# Patient Record
Sex: Female | Born: 1980
Health system: Southern US, Community
[De-identification: ages and names within clinical notes are randomized; demographics above are authoritative.]

## PROBLEM LIST (undated history)

## (undated) DIAGNOSIS — E669 Obesity, unspecified: Secondary | ICD-10-CM

## (undated) DIAGNOSIS — I890 Lymphedema, not elsewhere classified: Secondary | ICD-10-CM

---

## 2012-12-30 ENCOUNTER — Encounter (HOSPITAL_COMMUNITY): Payer: Self-pay | Admitting: Emergency Medicine

## 2012-12-30 ENCOUNTER — Emergency Department (HOSPITAL_COMMUNITY)
Admission: EM | Admit: 2012-12-30 | Discharge: 2012-12-30 | Disposition: A | Discharge status: Home / Self Care | Payer: 59 | Attending: Emergency Medicine | Admitting: Emergency Medicine

## 2012-12-30 DIAGNOSIS — M791 Myalgia, unspecified site: Secondary | ICD-10-CM

## 2012-12-30 DIAGNOSIS — Z3202 Encounter for pregnancy test, result negative: Secondary | ICD-10-CM | POA: Insufficient documentation

## 2012-12-30 DIAGNOSIS — R509 Fever, unspecified: Secondary | ICD-10-CM

## 2012-12-30 DIAGNOSIS — R600 Localized edema: Secondary | ICD-10-CM

## 2012-12-30 DIAGNOSIS — M79609 Pain in unspecified limb: Secondary | ICD-10-CM

## 2012-12-30 DIAGNOSIS — R609 Edema, unspecified: Secondary | ICD-10-CM | POA: Insufficient documentation

## 2012-12-30 DIAGNOSIS — M7989 Other specified soft tissue disorders: Secondary | ICD-10-CM

## 2012-12-30 LAB — URINE MICROSCOPIC-ADD ON

## 2012-12-30 LAB — URINALYSIS, ROUTINE W REFLEX MICROSCOPIC
Ketones, ur: 15 mg/dL — AB
Nitrite: NEGATIVE
Urobilinogen, UA: 1 mg/dL (ref 0.0–1.0)

## 2012-12-30 LAB — CBC
Hemoglobin: 12 g/dL (ref 12.0–15.0)
MCH: 29.9 pg (ref 26.0–34.0)
MCHC: 34.1 g/dL (ref 30.0–36.0)
MCV: 87.6 fL (ref 78.0–100.0)
Platelets: 194 10*3/uL (ref 150–400)

## 2012-12-30 LAB — BASIC METABOLIC PANEL
Calcium: 8 mg/dL — ABNORMAL LOW (ref 8.4–10.5)
Creatinine, Ser: 0.83 mg/dL (ref 0.50–1.10)
GFR calc non Af Amer: >90 mL/min (ref >90)
Glucose, Bld: 89 mg/dL (ref 70–99)
Sodium: 135 mEq/L (ref 135–145)

## 2012-12-30 MED ORDER — NAPROXEN 500 MG PO TABS: 500.0000 mg | ORAL_TABLET | Freq: Two times a day (BID) | ORAL | Status: DC

## 2012-12-30 NOTE — ED Provider Notes (Signed)
Medical screening examination/treatment/procedure(s) were performed by non-physician practitioner and as supervising physician I was immediately available for consultation/collaboration.  Gilda Crease, MD 12/30/12 [phone]

## 2012-12-30 NOTE — Progress Notes (Signed)
VASCULAR LAB PRELIMINARY  PRELIMINARY  PRELIMINARY  PRELIMINARY  Left lower extremity venous duplex completed.    Preliminary report:  Left:  No evidence of DVT, superficial thrombosis, or Baker's cyst.  Randa Riss, RVS 12/30/2012, 2:28 PM

## 2012-12-30 NOTE — ED Provider Notes (Signed)
History     CSN: [id number]  Arrival date & time 12/30/12  1159   First MD Initiated Contact with Patient 12/30/12 1203      Chief Complaint  Patient presents with  . Generalized Body Aches    (Consider location/radiation/quality/duration/timing/severity/associated sxs/prior treatment) HPI Comments: Allison Carroll is a 32 y.o. female with no significant past medical history presents emergency department complaining of fevers, night sweats, chills and body aches.  Patient states she was evaluated for similar symptoms yesterday including 2 emesis episodes.  Evaluation was at [facility] at which time she was placed on both Flagyl and Bactrim, however she does not know why she was placed in his antibiotics.  Patient was advised at discharge to go to the emergency department if fevers and night sweats continue.  Patient states that today she had a fever of 101 which is what she is here for evaluation for.  In addition patient states that she's been having intermittent left lower extremity calf pain and swelling.  She denies any chest pain, shortness of breath, hemoptysis, cough, estrogen use, smoking, recent travel, abdominal pain.  The history is provided by the patient.    History reviewed. No pertinent past medical history.  History reviewed. No pertinent past surgical history.  No family history on file.  History  Substance Use Topics  . Smoking status: Not on file  . Smokeless tobacco: Not on file  . Alcohol Use: Not on file    OB History   Grav Para Term Preterm Abortions TAB SAB Ect Mult Living                  Review of Systems  All other systems reviewed and are negative.    Allergies  Review of patient's allergies indicates no known allergies.  Home Medications   Current Outpatient Rx  Name  Route  Sig  Dispense  Refill  . ibuprofen (ADVIL,MOTRIN) 200 MG tablet   Oral   Take 200 mg by mouth every 6 (six) hours as needed for pain.         .  metroNIDAZOLE (FLAGYL) 500 MG tablet   Oral   Take 500 mg by mouth 3 (three) times daily. x10 days         . promethazine (PHENERGAN) 12.5 MG tablet   Oral   Take 12.5 mg by mouth every 6 (six) hours as needed for nausea.         Marland Kitchen sulfamethoxazole-trimethoprim (BACTRIM DS) 800-160 MG per tablet   Oral   Take 1 tablet by mouth 2 (two) times daily. x10 days           BP 140/65  Pulse 96  Temp(Src) 100.4 F (38 C) (Oral)  Resp 24  Physical Exam  Nursing note and vitals reviewed. Constitutional: She is oriented to person, place, and time. She appears well-developed and well-nourished. No distress.  HENT:  Head: Normocephalic and atraumatic.  Mouth/Throat: Oropharynx is clear and moist. No oropharyngeal exudate.  Eyes: Conjunctivae and EOM are normal. Pupils are equal, round, and reactive to light. No scleral icterus.  Neck: Normal range of motion. Neck supple. No tracheal deviation present. No thyromegaly present.  Cardiovascular: Normal rate, regular rhythm, normal heart sounds and intact distal pulses.   2+ pitting edema left lower extremity  Pulmonary/Chest: Effort normal and breath sounds normal. No stridor. No respiratory distress. She has no wheezes.  Abdominal: Soft.  Musculoskeletal: Normal range of motion. She exhibits tenderness. She exhibits no  edema.  Left lower posterior calf tenderness to palpation  Neurological: She is alert and oriented to person, place, and time. Coordination normal.  Skin: Skin is warm and dry. No rash noted. She is not diaphoretic. No erythema. No pallor.  Psychiatric: She has a normal mood and affect. Her behavior is normal.    ED Course  Procedures (including critical care time)  Labs Reviewed  URINALYSIS, ROUTINE W REFLEX MICROSCOPIC - Abnormal; Notable for the following:    Color, Urine AMBER (*)    APPearance TURBID (*)    Specific Gravity, Urine 1.039 (*)    Hgb urine dipstick SMALL (*)    Bilirubin Urine SMALL (*)     Ketones, ur 15 (*)    Leukocytes, UA SMALL (*)    All other components within normal limits  CBC - Abnormal; Notable for the following:    HCT 35.2 (*)    All other components within normal limits  BASIC METABOLIC PANEL - Abnormal; Notable for the following:    Calcium 8.0 (*)    All other components within normal limits  URINE MICROSCOPIC-ADD ON - Abnormal; Notable for the following:    Squamous Epithelial / LPF FEW (*)    All other components within normal limits  POCT PREGNANCY, URINE   No results found.   No diagnosis found. Consult to [facility]:  Pt seen yesrerday & was on doxy started for chronic cellulitis and acne. [facility]pped at [facility]. Fever 102. Treated lower extremity cellulitis w Bactrim, And started on flagyl for gastroenteritis.  VASCULAR LAB  PRELIMINARY PRELIMINARY PRELIMINARY PRELIMINARY  Left lower extremity venous duplex completed.  Preliminary report: Left: No evidence of DVT, superficial thrombosis, or Baker's cyst.  SLAUGHTER, [place], RVS  12/30/2012, 2:28 PM   MDM  Fever  32 year old patient presents emergency department complaining of fever.  Patient has been diagnosed with chronic cellulitis, gastroenteritis and possible urinary tract infection.  Patient was originally evaluated by infectious disease physician in [place] at which time she was placed on chronic penicillin.  Liter she was evaluated by a dermatologist to change her antibiotic to doxycycline.  Yesterday she was evaluated by [facility] and was told to stop taking doxycycline and placed on Bactrim and Flagyl.  Patient has been advised to find a primary care physician to follow her symptoms rather than bouncing around from doctor to doctor.  Labs and imaging reviewed.  No DVT of left lower extremity and Doppler.  No leukocytosis.  Patient presentation of myalgias likely due to viral type etiology. At this time there does not appear to be any evidence of an acute emergency medical condition and  the patient appears stable for discharge with appropriate outpatient follow up.Diagnosis was discussed with patient who verbalizes understanding and is agreeable to discharge.        Jaci Carrel, [place] 12/30/12 1445

## 2012-12-30 NOTE — ED Notes (Signed)
Per EMS-pt c/o of flu like symptoms that started early yesterday. Also c/o of generalized weakness, body aches, chills. Nausea no vomiting. NAD at this time.

## 2016-05-10 ENCOUNTER — Emergency Department (HOSPITAL_COMMUNITY)
Admission: EM | Admit: 2016-05-10 | Discharge: 2016-05-10 | Disposition: A | Discharge status: Home / Self Care | Payer: 59 | Attending: Emergency Medicine | Admitting: Emergency Medicine

## 2016-05-10 ENCOUNTER — Encounter (HOSPITAL_COMMUNITY): Payer: Self-pay | Admitting: Emergency Medicine

## 2016-05-10 ENCOUNTER — Emergency Department (HOSPITAL_COMMUNITY): Payer: 59

## 2016-05-10 DIAGNOSIS — S40022A Contusion of left upper arm, initial encounter: Secondary | ICD-10-CM | POA: Diagnosis not present

## 2016-05-10 DIAGNOSIS — W07XXXA Fall from chair, initial encounter: Secondary | ICD-10-CM | POA: Diagnosis not present

## 2016-05-10 DIAGNOSIS — Y999 Unspecified external cause status: Secondary | ICD-10-CM | POA: Insufficient documentation

## 2016-05-10 DIAGNOSIS — S4992XA Unspecified injury of left shoulder and upper arm, initial encounter: Secondary | ICD-10-CM | POA: Diagnosis present

## 2016-05-10 DIAGNOSIS — F17200 Nicotine dependence, unspecified, uncomplicated: Secondary | ICD-10-CM | POA: Insufficient documentation

## 2016-05-10 DIAGNOSIS — J0100 Acute maxillary sinusitis, unspecified: Secondary | ICD-10-CM | POA: Diagnosis not present

## 2016-05-10 DIAGNOSIS — K0889 Other specified disorders of teeth and supporting structures: Secondary | ICD-10-CM | POA: Diagnosis not present

## 2016-05-10 DIAGNOSIS — Y929 Unspecified place or not applicable: Secondary | ICD-10-CM | POA: Diagnosis not present

## 2016-05-10 DIAGNOSIS — Y939 Activity, unspecified: Secondary | ICD-10-CM | POA: Insufficient documentation

## 2016-05-10 HISTORY — DX: Obesity, unspecified: E66.9

## 2016-05-10 MED ORDER — AMOXICILLIN-POT CLAVULANATE 875-125 MG PO TABS: 1.0000 | ORAL_TABLET | Freq: Two times a day (BID) | ORAL | 0 refills | Status: DC

## 2016-05-10 NOTE — ED Notes (Addendum)
Attempted to give Pt yellow traction socks, Pt refused. RN notifed

## 2016-05-10 NOTE — ED Provider Notes (Signed)
[facility] Provider Note   CSN: [id number] Arrival date & time: 05/10/16  4098  First Provider Contact:  First MD Initiated Contact with Patient 05/10/16 0654        History   Chief Complaint Chief Complaint  Patient presents with  . Dental Pain  . Fall    HPI Allison Carroll is a 35 y.o. female.  The history is provided by the patient. No language interpreter was used.  Dental Pain    Fall     Allison Carroll is a 35 y.o. female who presents to the Emergency Department complaining of dental pain.  She presents for evaluation of dental pain. Yesterday she developed pain over her right lower end of her jaw associated with several days of nasal congestion and drainage. She has occasional dizziness and nausea. Pain became severe last night and she presented to the emergency department for evaluation. She took naproxen and Orajel prior to ED arrival. During her ED stay her pain has resolved but she did have a fall out of the triage chair landing onto her left arm. She reports pain with movement over the left upper arm. She is not sure how she fell.  Past Medical History:  Diagnosis Date  . Obesity     There are no active problems to display for this patient.   History reviewed. No pertinent surgical history.  OB History    No data available       Home Medications    Prior to Admission medications   Medication Sig Start Date End Date Taking? Authorizing Provider  ibuprofen (ADVIL,MOTRIN) 200 MG tablet Take 200 mg by mouth every 6 (six) hours as needed for pain.    Historical Provider, MD  metroNIDAZOLE (FLAGYL) 500 MG tablet Take 500 mg by mouth 3 (three) times daily. x10 days    Historical Provider, MD  naproxen (NAPROSYN) 500 MG tablet Take 1 tablet (500 mg total) by mouth 2 (two) times daily. 12/30/12   Lisette Paz, PA-C  promethazine (PHENERGAN) 12.5 MG tablet Take 12.5 mg by mouth every 6 (six) hours as needed for nausea.    Historical Provider, MD    sulfamethoxazole-trimethoprim (BACTRIM DS) 800-160 MG per tablet Take 1 tablet by mouth 2 (two) times daily. x10 days    Historical Provider, MD    Family History No family history on file.  Social History Social History  Substance Use Topics  . Smoking status: Current Every Day Smoker  . Smokeless tobacco: Not on file  . Alcohol use No     Allergies   Review of patient's allergies indicates no known allergies.   Review of Systems Review of Systems  All other systems reviewed and are negative.    Physical Exam Updated Vital Signs BP 126/85 (BP Location: Right Arm)   Pulse 70   Temp 98.7 F (37.1 C) (Oral)   Resp 17   Ht  (1.753 m)   Wt (!) 346 lb (156.9 kg)   LMP 05/09/2016   SpO2 100%   BMI 51.10 kg/m   Physical Exam  Constitutional: She is oriented to person, place, and time. She appears well-developed and well-nourished.  HENT:  Head: Normocephalic and atraumatic.  Mouth/Throat: Oropharynx is clear and moist.    Mild right maxillary tenderness  Cardiovascular: Normal rate and regular rhythm.   No murmur heard. Pulmonary/Chest: Effort normal and breath sounds normal. No respiratory distress.  Musculoskeletal:  2+ radial pulses bilaterally. There is soft tissue tenderness to the left  upper arm. There is no bony tenderness throughout the shoulder, elbow, wrist. Full range of motion is present in the left shoulder and elbow.  Neurological: She is alert and oriented to person, place, and time.  Skin: Skin is warm and dry.  Psychiatric: She has a normal mood and affect. Her behavior is normal.  Nursing note and vitals reviewed.    ED Treatments / Results  Labs (all labs ordered are listed, but only abnormal results are displayed) Labs Reviewed - No data to display  EKG  EKG Interpretation None       Radiology Dg Elbow Complete Left  Result Date: 05/10/2016 CLINICAL DATA:  Status post fall, with left arm pain. Initial encounter. EXAM: LEFT  ELBOW - COMPLETE 3+ VIEW COMPARISON:  None. FINDINGS: There is no evidence of fracture or dislocation. The visualized joint spaces are preserved. No significant joint effusion is identified. The soft tissues are unremarkable in appearance. IMPRESSION: No evidence of fracture or dislocation. Electronically Signed   By: Roanna RaiderJeffery  Chang M.D.   On: 05/10/2016 05:47    Procedures Procedures (including critical care time)  Medications Ordered in ED Medications - No data to display   Initial Impression / Assessment and Plan / ED Course  I have reviewed the triage vital signs and the nursing notes.  Pertinent labs & imaging results that were available during my care of the patient were reviewed by me and considered in my medical decision making (see chart for details).  Clinical Course    Pt here for evaluation of dental/facial pain.  She is nontoxic appearing on exam.  She does have dental decay but no overt abscess.  Will treat for potential sinusitis given nasal congestion, maxillary tenderness, dizziness.  Plan to d/c home with abx, decongestant otc, PCP and dentistry follow up.  In terms of her fall/arm pain - exam with local tenderness, no evidence of acute fracture/dislocation - discussed rest, naproxen, outpatient follow up.    Final Clinical Impressions(s) / ED Diagnoses   Final diagnoses:  Pain, dental  Arm contusion, left, initial encounter  Acute maxillary sinusitis, recurrence not specified    New Prescriptions New Prescriptions   No medications on file     Tilden FossaElizabeth Nicoli Nardozzi, MD 05/10/16 [id number]

## 2016-05-10 NOTE — ED Notes (Signed)
Dr. Dalene SeltzerSchlossman ( EDP ) notified on pt.'s fall /conditon .

## 2016-05-10 NOTE — ED Triage Notes (Signed)
Pt. reports right lower dental pain onset last night unrelieved by OTC pain medication , pt. tripped and  fell after triage while trying to get up from the chair, pt. assisted by triage nurse / EMT to a wheelchair  . Pt.  reports pain at left elbow .

## 2018-03-16 ENCOUNTER — Ambulatory Visit (HOSPITAL_COMMUNITY)
Admission: EM | Admit: 2018-03-16 | Discharge: 2018-03-16 | Disposition: A | Discharge status: Home / Self Care | Payer: 59 | Attending: Internal Medicine | Admitting: Internal Medicine

## 2018-03-16 ENCOUNTER — Encounter (HOSPITAL_COMMUNITY): Payer: Self-pay | Admitting: Family Medicine

## 2018-03-16 DIAGNOSIS — M25521 Pain in right elbow: Secondary | ICD-10-CM | POA: Diagnosis not present

## 2018-03-16 MED ORDER — MELOXICAM 15 MG PO TABS: 15.0000 mg | ORAL_TABLET | Freq: Every day | ORAL | 0 refills | Status: DC

## 2018-03-16 NOTE — ED Provider Notes (Signed)
[facility]    CSN: [id number] Arrival date & time: 03/16/18  1517     History   Chief Complaint Chief Complaint  Patient presents with  . Arm Pain    HPI Allison Carroll is a 37 y.o. female.   37 year old female comes in for 3 day history of right lower arm/elbow pain. States she woke up with the pain, that is worse with elbow movement. States she had someone pull her arm and the stretching made the pain feel better. However, yesterday, symptoms worsened and has trouble extending and flexing elbow completely. Denies radiation of pain, numbness/tingling. Denies injury/trauma. Has not taken anything for the symptom. Does have repetitive motion at work.      Past Medical History:  Diagnosis Date  . Obesity     There are no active problems to display for this patient.   History reviewed. No pertinent surgical history.  OB History   None      Home Medications    Prior to Admission medications   Medication Sig Start Date End Date Taking? Authorizing Provider  buPROPion (WELLBUTRIN) 75 MG tablet Take 75 mg by mouth 2 (two) times daily.   Yes [provider]  Vilazodone HCl (VIIBRYD) 40 MG TABS Take by mouth daily.   Yes [provider]  meloxicam (MOBIC) 15 MG tablet Take 1 tablet (15 mg total) by mouth daily. 03/16/18   Belinda Fisher, PA-C    Family History History reviewed. No pertinent family history.  Social History Social History   Tobacco Use  . Smoking status: Current Every Day Smoker  Substance Use Topics  . Alcohol use: No  . Drug use: No     Allergies   Patient has no known allergies.   Review of Systems Review of Systems  Reason unable to perform ROS: See HPI as above.     Physical Exam Triage Vital Signs ED Triage Vitals  Enc Vitals Group     BP 03/16/18 1614 126/71     Pulse Rate 03/16/18 1614 84     Resp 03/16/18 1614 18     Temp 03/16/18 1614 98 F (36.7 C)     Temp src --      SpO2 03/16/18 1614 100 %      Weight 03/16/18 1615 (!) 368 lb (166.9 kg)     Height --      Head Circumference --      Peak Flow --      Pain Score 03/16/18 1611 8     Pain Loc --      Pain Edu? --      Excl. in GC? --    No data found.  Updated Vital Signs BP 126/71   Pulse 84   Temp 98 F (36.7 C)   Resp 18   Wt (!) 368 lb (166.9 kg)   LMP 03/09/2018   SpO2 100%   BMI 54.34 kg/m   Physical Exam  Constitutional: She is oriented to person, place, and time. She appears well-developed and well-nourished. No distress.  HENT:  Head: Normocephalic and atraumatic.  Eyes: Pupils are equal, round, and reactive to light. Conjunctivae are normal.  Musculoskeletal:  No swelling, erythema, increased warmth, contusion. No tenderness to palpation of the shoulder, wrist. Tenderness to palpation of upper and lower arm. Tenderness to palpation of bilateral epicondyle. No tenderness to palpation of olecranon process. Full ROM of shoulder, wrist, hand. Mild decreased flexion and extension. Strength normal and  equal bilaterally, though with pain. Sensation intact and equal bilaterally. Radial pulse 2+ and equal bilaterally. Cap refill <2s  Neurological: She is alert and oriented to person, place, and time.     UC Treatments / Results  Labs (all labs ordered are listed, but only abnormal results are displayed) Labs Reviewed - No data to display  EKG None  Radiology No results found.  Procedures Procedures (including critical care time)  Medications Ordered in UC Medications - No data to display  Initial Impression / Assessment and Plan / UC Course  I have reviewed the triage vital signs and the nursing notes.  Pertinent labs & imaging results that were available during my care of the patient were reviewed by me and considered in my medical decision making (see chart for details).    Will treat for tendinitis. Start NSAID as directed for pain and inflammation. Ice compress, elevation, rest. Return  precautions given.   Final Clinical Impressions(s) / UC Diagnoses   Final diagnoses:  Right elbow pain    ED Prescriptions    Medication Sig Dispense Auth. Provider   meloxicam (MOBIC) 15 MG tablet Take 1 tablet (15 mg total) by mouth daily. 15 tablet Threasa AlphaYu, Jordy Hewins V, PA-C        Yoni Lobos V, [place] 03/16/18 1712

## 2018-03-16 NOTE — Discharge Instructions (Signed)
I am treating you for inflammation of the right elbow, most likely from your tendon. Start Mobic. Do not take ibuprofen (motrin/advil)/ naproxen (aleve) while on mobic. Ice/heat compresses as needed. This can take a few weeks to completely resolve, but you should be feeling better each week. Follow up here or with orthopedics for further evaluation if symptoms not improving.

## 2018-03-16 NOTE — ED Triage Notes (Signed)
Pt here for right arm pain since Saturday. She woke up this way. She felt like she had a muscle spasm and someone pulled her arm and stretched it making it feel better. She reports since the pain has increased and worse today. No injury.

## 2018-04-22 ENCOUNTER — Other Ambulatory Visit (HOSPITAL_COMMUNITY)
Admission: RE | Admit: 2018-04-22 | Discharge: 2018-04-22 | Disposition: A | Discharge status: Home / Self Care | Payer: 59 | Source: Ambulatory Visit | Attending: Family Medicine | Admitting: Family Medicine

## 2018-04-22 ENCOUNTER — Other Ambulatory Visit: Payer: Self-pay | Admitting: Family Medicine

## 2018-04-22 DIAGNOSIS — Z01419 Encounter for gynecological examination (general) (routine) without abnormal findings: Secondary | ICD-10-CM | POA: Diagnosis not present

## 2018-04-23 LAB — CYTOLOGY - PAP: Diagnosis: NEGATIVE

## 2018-06-30 ENCOUNTER — Other Ambulatory Visit: Payer: Self-pay

## 2018-06-30 ENCOUNTER — Encounter (HOSPITAL_COMMUNITY): Payer: Self-pay | Admitting: *Deleted

## 2018-06-30 ENCOUNTER — Ambulatory Visit (HOSPITAL_COMMUNITY)
Admission: EM | Admit: 2018-06-30 | Discharge: 2018-06-30 | Disposition: A | Discharge status: Home / Self Care | Payer: 59 | Attending: Family Medicine | Admitting: Family Medicine

## 2018-06-30 DIAGNOSIS — L03116 Cellulitis of left lower limb: Secondary | ICD-10-CM | POA: Diagnosis not present

## 2018-06-30 HISTORY — DX: Lymphedema, not elsewhere classified: I89.0

## 2018-06-30 MED ORDER — DOXYCYCLINE HYCLATE 100 MG PO CAPS: 100.0000 mg | ORAL_CAPSULE | Freq: Two times a day (BID) | ORAL | 0 refills | Status: AC

## 2018-06-30 NOTE — ED Triage Notes (Signed)
C/o swelling to left leg states she has lymphedema  Onset today with onset of fever today

## 2018-06-30 NOTE — ED Provider Notes (Signed)
  [facility]    CSN: [id number] Arrival date & time: 06/30/18  1405  Chief Complaint  Patient presents with  . Fever  . Leg Swelling    Allison Carroll is a 37 y.o. female here for a skin complaint.  Duration: 1 day Location: LLE Pruritic? No Painful? Yes Drainage? No New soaps/lotions/topicals/detergents? No Sick contacts? No Other associated symptoms: fever Therapies tried thus far: none  ROS:  Const: + fevers Skin: As noted in HPI  Past Medical History:  Diagnosis Date  . Lymphedema   . Obesity    No Known Allergies   BP 129/87 (BP Location: Right Arm)   Pulse 98   Temp 99.8 F (37.7 C) (Oral)   Resp 20   LMP 06/11/2018 (Exact Date)   SpO2 96%  Gen: awake, alert, appearing stated age Lungs: No accessory muscle use Skin: Right lower extremities unremarkable.  In the left lower extremity is with 1+ pitting edema up to the distal third of the tibia.  There is erythema and warmth compared to the other side.  It is mildly tender to palpation.  It encompasses at the circumference of the lower extremity.. No drainage, fluctuance, excoriation Psych: Age appropriate judgment and insight   Final Clinical Impressions(s) / UC Diagnoses   Final diagnoses:  Cellulitis of left lower extremity   Given fever, will treat.  Encouraged her to lose weight to decrease incidence of this in the future.  Discussed need for follow-up care.  Follow-up with PCP if no improvement.  The patient voiced understanding and agreement the plan.  ED Prescriptions    Medication Sig Dispense Auth. Provider   doxycycline (VIBRAMYCIN) 100 MG capsule Take 1 capsule (100 mg total) by mouth 2 (two) times daily for 7 days. 14 capsule Sharlene Dory, DO        Arva Chafe [place], [place] 06/30/18 1451

## 2019-08-12 ENCOUNTER — Encounter: Payer: Self-pay | Admitting: Internal Medicine

## 2019-08-16 ENCOUNTER — Ambulatory Visit: Payer: Self-pay | Admitting: Family Medicine

## 2019-08-19 ENCOUNTER — Ambulatory Visit: Payer: No Typology Code available for payment source | Admitting: Family Medicine

## 2019-08-19 ENCOUNTER — Other Ambulatory Visit: Payer: Self-pay

## 2019-08-19 ENCOUNTER — Encounter: Payer: Self-pay | Admitting: Family Medicine

## 2019-10-06 ENCOUNTER — Ambulatory Visit: Payer: No Typology Code available for payment source | Admitting: Family Medicine

## 2021-06-17 ENCOUNTER — Emergency Department (HOSPITAL_BASED_OUTPATIENT_CLINIC_OR_DEPARTMENT_OTHER)
Admission: EM | Admit: 2021-06-17 | Discharge: 2021-06-17 | Disposition: A | Discharge status: Home / Self Care | Payer: Medicaid Other | Attending: Emergency Medicine | Admitting: Emergency Medicine

## 2021-06-17 ENCOUNTER — Other Ambulatory Visit: Payer: Self-pay

## 2021-06-17 ENCOUNTER — Emergency Department (HOSPITAL_BASED_OUTPATIENT_CLINIC_OR_DEPARTMENT_OTHER): Payer: Medicaid Other

## 2021-06-17 ENCOUNTER — Encounter (HOSPITAL_BASED_OUTPATIENT_CLINIC_OR_DEPARTMENT_OTHER): Payer: Self-pay | Admitting: Emergency Medicine

## 2021-06-17 DIAGNOSIS — R079 Chest pain, unspecified: Secondary | ICD-10-CM

## 2021-06-17 DIAGNOSIS — F17200 Nicotine dependence, unspecified, uncomplicated: Secondary | ICD-10-CM | POA: Insufficient documentation

## 2021-06-17 LAB — BASIC METABOLIC PANEL
Anion gap: 6 (ref 5–15)
BUN: 9 mg/dL (ref 6–20)
CO2: 29 mmol/L (ref 22–32)
Calcium: 9.1 mg/dL (ref 8.9–10.3)
Chloride: 103 mmol/L (ref 98–111)
Creatinine, Ser: 0.81 mg/dL (ref 0.44–1.00)
GFR, Estimated: >60 mL/min (ref >60)
Glucose, Bld: 81 mg/dL (ref 70–99)
Potassium: 4.5 mmol/L (ref 3.5–5.1)
Sodium: 138 mmol/L (ref 135–145)

## 2021-06-17 LAB — CBC
HCT: 36.8 % (ref 36.0–46.0)
Hemoglobin: 12 g/dL (ref 12.0–15.0)
MCH: 29.3 pg (ref 26.0–34.0)
MCHC: 32.6 g/dL (ref 30.0–36.0)
MCV: 90 fL (ref 80.0–100.0)
Platelets: 273 10*3/uL (ref 150–400)
RBC: 4.09 MIL/uL (ref 3.87–5.11)
RDW: 13.6 % (ref 11.5–15.5)
WBC: 5.9 10*3/uL (ref 4.0–10.5)
nRBC: 0 % (ref 0.0–0.2)

## 2021-06-17 LAB — PREGNANCY, URINE: Preg Test, Ur: NEGATIVE

## 2021-06-17 LAB — TROPONIN I (HIGH SENSITIVITY)
Troponin I (High Sensitivity): <2 ng/L (ref <18)
Troponin I (High Sensitivity): <2 ng/L (ref <18)

## 2021-06-17 MED ORDER — ASPIRIN 81 MG PO CHEW
324.0000 mg | CHEWABLE_TABLET | Freq: Once | ORAL | Status: AC
  Administered 2021-06-17: 324 mg via ORAL
  Filled 2021-06-17: qty 4

## 2021-06-17 MED ORDER — ASPIRIN 81 MG PO CHEW: 324.0000 mg | CHEWABLE_TABLET | Freq: Once | ORAL | Status: DC

## 2021-06-17 NOTE — ED Provider Notes (Signed)
[facility] EMERGENCY DEPT Provider Note   CSN: [id number] Arrival date & time: 06/17/21  1036     History Chief Complaint  Patient presents with   Chest Pain    Allison Carroll is a 40 y.o. female.  HPI 40 year old female presents today complaining of left-sided chest pain.  It is 9 out of 10 when it occurs.  It lasts approximately 10 seconds.  It comes and goes.  It is cramping and there is some paresthesias associated with it.  She feels that it gets worse when she has certain trunk movements or arm movements.  She has no no known injury.  She does not have any significantly associated symptoms such as dyspnea, lightheadedness, diaphoresis.  He states that she has had some episodes of dyspnea that have not been associated with this pain.  She does not have any risk factors except for obesity.  She has a history of smoking in the past states that she only smokes occasional weed at this time.  She denies any illicit drug use.  She does not have any significant family history stating that maybe her grandmother had heart disease.  She denies any history of DVT, PE, leg swelling, or risk factors for same. A 40 year old patient with a history of obesity presents for evaluation of chest pain. Initial onset of pain was approximately 1-3 hours ago. The patient's chest pain is well-localized, is sharp and is not worse with exertion. The patient's chest pain is middle- or left-sided, is not described as heaviness/pressure/tightness and does not radiate to the arms/jaw/neck. The patient does not complain of nausea and denies diaphoresis. The patient has no history of stroke, has no history of peripheral artery disease, has not smoked in the past 90 days, denies any history of treated diabetes, has no relevant family history of coronary artery disease (first degree relative at less than age 35), is not hypertensive and has no history of hypercholesterolemia.   Past Medical  History:  Diagnosis Date   Lymphedema    Obesity     There are no problems to display for this patient.   History reviewed. No pertinent surgical history.   OB History   No obstetric history on file.     History reviewed. No pertinent family history.  Social History   Tobacco Use   Smoking status: Every Day   Smokeless tobacco: Never  Substance Use Topics   Alcohol use: No   Drug use: No    Home Medications Prior to Admission medications   Medication Sig Start Date End Date Taking? Authorizing Provider  buPROPion (WELLBUTRIN) 75 MG tablet Take 75 mg by mouth 2 (two) times daily.    [provider]  Vilazodone HCl (VIIBRYD) 40 MG TABS Take by mouth daily.    [provider]    Allergies    Patient has no known allergies.  Review of Systems   Review of Systems  All other systems reviewed and are negative.  Physical Exam Updated Vital Signs BP (!) 144/91 (BP Location: Left Arm)   Pulse 63   Temp 98.1 F (36.7 C) (Oral)   Resp 18   SpO2 97%   Physical Exam Vitals and nursing note reviewed.  Constitutional:      Appearance: She is well-developed. She is obese.  HENT:     Head: Normocephalic.  Eyes:     Pupils: Pupils are equal, round, and reactive to light.  Cardiovascular:     Rate and Rhythm: Normal  rate and regular rhythm.     Heart sounds: Normal heart sounds.  Pulmonary:     Effort: Pulmonary effort is normal.     Breath sounds: Normal breath sounds.  Chest:     Chest wall: No mass, deformity or tenderness.  Abdominal:     General: Bowel sounds are normal.     Palpations: Abdomen is soft.  Musculoskeletal:        General: Normal range of motion.  Skin:    General: Skin is warm.     Capillary Refill: Capillary refill takes less than 2 seconds.  Neurological:     General: No focal deficit present.     Mental Status: She is alert.    ED Results / Procedures / Treatments   Labs (all labs ordered are listed, but only  abnormal results are displayed) Labs Reviewed - No data to display  EKG EKG Interpretation  Date/Time:  Sunday June 17 2021 10:46:42 EDT Ventricular Rate:  65 PR Interval:  154 QRS Duration: 88 QT Interval:  406 QTC Calculation: 423 R Axis:   26 Text Interpretation: Sinus rhythm Probable left atrial enlargement Low voltage, precordial leads No old tracing to compare Confirmed by Margarita Grizzle [phone]) on 06/17/2021 11:06:33 AM  Radiology No results found.  Procedures Procedures   Medications Ordered in ED Medications - No data to display  ED Course  I have reviewed the triage vital signs and the nursing notes.  Pertinent labs & imaging results that were available during my care of the patient were reviewed by me and considered in my medical decision making (see chart for details).  Clinical Course as of 06/17/21 1455  Sun Jun 17, 2021  1455 Troponin I (High Sensitivity) [DR]    Clinical Course User Index [DR] Margarita Grizzle, MD   MDM Rules/Calculators/A&P HEAR Score: 1                         Patient seen and evaluated for chest pain.  Differential diagnosis of serious/life threatening causes of chest pain includes ACS, other diseases of the heart such as myocarditis or pericarditis, lung etiologies such as infection or pneumothorax, diseases of the great vessels such as aortic dissection or AAA, pulmonary embolism, or GI sources such as cholecystitis or other upper abdominal causes. Doubt ACS- heart score documented, EKG reviewed, Given the timing of pain to ER presentation, single troponin2, delta troponi2 was so doubt NSTEMI troponin and repeat troponin obtained and WNL Doubt myocarditis/pericarditis/tamponade based on history, review of ekg and labs Doubt aortic dissection based on history and review of imaging Doubt intrinsic lung causes such as pneumonia or pneumothorax, based on history, physical exam, and studies obtained. Doubt PE based on history, physical  exam, and PERC Doubt acute GI etiology requiring intervention based on history, physical exam and labs. Patient appears stable for discharge. Return precautions and need for follow up discussed and patient voices understanding  Final Clinical Impression(s) / ED Diagnoses Final diagnoses:  Chest pain, unspecified type    Rx / DC Orders ED Discharge Orders     None        Margarita Grizzle, MD 06/17/21 1456

## 2021-06-17 NOTE — ED Notes (Signed)
ED Provider at bedside. 

## 2021-06-17 NOTE — ED Triage Notes (Signed)
Chest pain for 2 weeks. Chest pain worsened today at church ,felt like burning and spasms.

## 2021-06-17 NOTE — Discharge Instructions (Addendum)
Your evaluated here in the emergency department today for chest pain.  Your heart tracing and heart enzymes are normal.  Remainder of your labs are normal.  Your chest pain does not appear to be caused by heart attack, blood clots in your lungs, infection in your lungs or other lung or heart etiologies.  Pain in the chest can come from many reasons including the chest wall and the joints in the chest.  Please use ibuprofen as needed for pain.  If your pain becomes worse or you begin having new symptoms such as shortness of breath or fever please return for reevaluation.  Follow-up with your provider this week.

## 2021-12-06 ENCOUNTER — Ambulatory Visit (INDEPENDENT_AMBULATORY_CARE_PROVIDER_SITE_OTHER): Payer: Medicaid Other | Admitting: Primary Care

## 2021-12-06 ENCOUNTER — Encounter (INDEPENDENT_AMBULATORY_CARE_PROVIDER_SITE_OTHER): Payer: Self-pay

## 2022-01-21 ENCOUNTER — Ambulatory Visit (INDEPENDENT_AMBULATORY_CARE_PROVIDER_SITE_OTHER): Payer: 59

## 2022-01-21 ENCOUNTER — Encounter (HOSPITAL_COMMUNITY): Payer: Self-pay

## 2022-01-21 ENCOUNTER — Ambulatory Visit (HOSPITAL_COMMUNITY)
Admission: EM | Admit: 2022-01-21 | Discharge: 2022-01-21 | Disposition: A | Discharge status: Home / Self Care | Payer: 59 | Attending: Physician Assistant | Admitting: Physician Assistant

## 2022-01-21 DIAGNOSIS — M62838 Other muscle spasm: Secondary | ICD-10-CM

## 2022-01-21 DIAGNOSIS — M542 Cervicalgia: Secondary | ICD-10-CM

## 2022-01-21 MED ORDER — CYCLOBENZAPRINE HCL 5 MG PO TABS: 5.0000 mg | ORAL_TABLET | Freq: Three times a day (TID) | ORAL | 0 refills | Status: AC | PRN

## 2022-01-21 MED ORDER — IBUPROFEN 600 MG PO TABS: 600.0000 mg | ORAL_TABLET | Freq: Four times a day (QID) | ORAL | 0 refills | Status: AC | PRN
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
## 2022-01-21 NOTE — Discharge Instructions (Addendum)
Recommend Ibuprofen as needed over the next week ?Take Flexeril as needed for muscle spasm ?Can apply ice or heat to affected areas ?Recommend light stretching ?If no improvement follow up with your primary care physician or orthopedics.  ?

## 2022-10-15 ENCOUNTER — Encounter (HOSPITAL_BASED_OUTPATIENT_CLINIC_OR_DEPARTMENT_OTHER): Payer: Self-pay | Admitting: Emergency Medicine

## 2022-10-15 ENCOUNTER — Emergency Department (HOSPITAL_BASED_OUTPATIENT_CLINIC_OR_DEPARTMENT_OTHER): Payer: BLUE CROSS/BLUE SHIELD | Admitting: Radiology

## 2022-10-15 ENCOUNTER — Other Ambulatory Visit: Payer: Self-pay

## 2022-10-15 DIAGNOSIS — R0789 Other chest pain: Secondary | ICD-10-CM | POA: Diagnosis not present

## 2022-10-15 DIAGNOSIS — R079 Chest pain, unspecified: Secondary | ICD-10-CM | POA: Diagnosis present

## 2022-10-15 LAB — CBC
HCT: 37.6 % (ref 36.0–46.0)
Hemoglobin: 12.1 g/dL (ref 12.0–15.0)
MCH: 28.3 pg (ref 26.0–34.0)
MCHC: 32.2 g/dL (ref 30.0–36.0)
MCV: 87.9 fL (ref 80.0–100.0)
Platelets: 286 10*3/uL (ref 150–400)
RBC: 4.28 MIL/uL (ref 3.87–5.11)
RDW: 13.2 % (ref 11.5–15.5)
WBC: 8.4 10*3/uL (ref 4.0–10.5)
nRBC: 0 % (ref 0.0–0.2)

## 2022-10-15 LAB — BASIC METABOLIC PANEL
Anion gap: 7 (ref 5–15)
BUN: 13 mg/dL (ref 6–20)
CO2: 29 mmol/L (ref 22–32)
Calcium: 9.3 mg/dL (ref 8.9–10.3)
Chloride: 101 mmol/L (ref 98–111)
Creatinine, Ser: 1.02 mg/dL — ABNORMAL HIGH (ref 0.44–1.00)
GFR, Estimated: >60 mL/min (ref >60)
Glucose, Bld: 93 mg/dL (ref 70–99)
Potassium: 4.2 mmol/L (ref 3.5–5.1)
Sodium: 137 mmol/L (ref 135–145)

## 2022-10-15 LAB — TROPONIN I (HIGH SENSITIVITY): Troponin I (High Sensitivity): <2 ng/L (ref <18)

## 2022-10-15 NOTE — ED Triage Notes (Signed)
Left side chest pain started 2 hours ago. Comes and goes Denies sob, lightheadedness. N/v  Recently moved storage unit, some heavy lifting

## 2022-10-16 ENCOUNTER — Emergency Department (HOSPITAL_BASED_OUTPATIENT_CLINIC_OR_DEPARTMENT_OTHER)
Admission: EM | Admit: 2022-10-16 | Discharge: 2022-10-16 | Disposition: A | Discharge status: Home / Self Care | Payer: BLUE CROSS/BLUE SHIELD | Attending: Emergency Medicine | Admitting: Emergency Medicine

## 2022-10-16 DIAGNOSIS — R0789 Other chest pain: Secondary | ICD-10-CM | POA: Diagnosis not present

## 2022-10-16 DIAGNOSIS — R079 Chest pain, unspecified: Secondary | ICD-10-CM

## 2022-10-16 LAB — D-DIMER, QUANTITATIVE: D-Dimer, Quant: 0.39 ug/mL-FEU (ref 0.00–0.50)

## 2022-10-16 LAB — TROPONIN I (HIGH SENSITIVITY): Troponin I (High Sensitivity): <2 ng/L (ref <18)

## 2022-10-16 NOTE — ED Provider Notes (Signed)
[facility] EMERGENCY DEPT Provider Note   CSN: [id number] Arrival date & time: 10/15/22  1947     History  Chief Complaint  Patient presents with   Chest Pain    Allison Carroll is a 42 y.o. female.  Patient with left-sided chest pain since approximately 5:30 PM.  Pain started shortly after moving a mattress in a storage unit but denies any pain while moving the mattress or exertional pain.  She describes soreness to her left chest that is worse with movement and palpation.  Waxes and wanes in severity but never goes away completely.  She is never had this kind of pain before.  Pain is to left upper chest without radiation to her arm, back or neck.  No associated shortness of breath.  No cough, fever, runny nose or sore throat.  No nausea, vomiting or diaphoresis.  No known cardiac history.  She is never had a stress test.  Former smoker. No history of blood clot.  No leg pain or leg swelling.  The history is provided by the patient.  Chest Pain Associated symptoms: no abdominal pain, no dizziness, no fever, no headache, no nausea, no shortness of breath, no vomiting and no weakness        Home Medications Prior to Admission medications   Medication Sig Start Date End Date Taking? Authorizing Provider  buPROPion (WELLBUTRIN) 75 MG tablet Take 75 mg by mouth 2 (two) times daily.    [provider]  cyclobenzaprine (FLEXERIL) 5 MG tablet Take 1 tablet (5 mg total) by mouth 3 (three) times daily as needed for muscle spasms. 01/21/22   Ward, Tylene Fantasia, PA-C  ibuprofen (ADVIL) 600 MG tablet Take 1 tablet (600 mg total) by mouth every 6 (six) hours as needed. 01/21/22   Ward, Tylene Fantasia, PA-C  Vilazodone HCl (VIIBRYD) 40 MG TABS Take by mouth daily.    [provider]      Allergies    Patient has no known allergies.    Review of Systems   Review of Systems  Constitutional:  Negative for activity change, appetite change and fever.  HENT:   Negative for congestion and rhinorrhea.   Respiratory:  Positive for chest tightness. Negative for shortness of breath.   Cardiovascular:  Positive for chest pain.  Gastrointestinal:  Negative for abdominal pain, nausea and vomiting.  Genitourinary:  Negative for dysuria.  Musculoskeletal:  Negative for arthralgias and myalgias.  Neurological:  Negative for dizziness, weakness and headaches.   all other systems are negative except as noted in the HPI and PMH.    Physical Exam Updated Vital Signs BP (!) 158/108 (BP Location: Right Wrist)   Pulse 73   Temp 97.7 F (36.5 C)   Resp 20   LMP 09/20/2022   SpO2 100%  Physical Exam Vitals and nursing note reviewed.  Constitutional:      General: She is not in acute distress.    Appearance: She is well-developed. She is obese.  HENT:     Head: Normocephalic and atraumatic.     Mouth/Throat:     Pharynx: No oropharyngeal exudate.  Eyes:     Conjunctiva/sclera: Conjunctivae normal.     Pupils: Pupils are equal, round, and reactive to light.  Neck:     Comments: No meningismus. Cardiovascular:     Rate and Rhythm: Normal rate and regular rhythm.     Heart sounds: Normal heart sounds. No murmur heard. Pulmonary:     Effort: Pulmonary effort  is normal. No respiratory distress.     Breath sounds: Normal breath sounds.     Comments: Reproducible left chest wall tenderness, worse with arm movement Chest:     Chest wall: Tenderness present.  Abdominal:     Palpations: Abdomen is soft.     Tenderness: There is no abdominal tenderness. There is no guarding or rebound.  Musculoskeletal:        General: No tenderness. Normal range of motion.     Cervical back: Normal range of motion and neck supple.  Skin:    General: Skin is warm.  Neurological:     Mental Status: She is alert and oriented to person, place, and time.     Cranial Nerves: No cranial nerve deficit.     Motor: No abnormal muscle tone.     Coordination: Coordination  normal.     Comments:  5/5 strength throughout. CN 2-12 intact.Equal grip strength.   Psychiatric:        Behavior: Behavior normal.     ED Results / Procedures / Treatments   Labs (all labs ordered are listed, but only abnormal results are displayed) Labs Reviewed  BASIC METABOLIC PANEL - Abnormal; Notable for the following components:      Result Value   Creatinine, Ser 1.02 (*)    All other components within normal limits  CBC  D-DIMER, QUANTITATIVE  PREGNANCY, URINE  TROPONIN I (HIGH SENSITIVITY)  TROPONIN I (HIGH SENSITIVITY)    EKG EKG Interpretation  Date/Time:  Tuesday October 15 2022 20:01:17 EST Ventricular Rate:  73 PR Interval:  160 QRS Duration: 82 QT Interval:  392 QTC Calculation: 431 R Axis:   41 Text Interpretation: Normal sinus rhythm Normal ECG When compared with ECG of 17-Jun-2021 10:46, PREVIOUS ECG IS PRESENT No significant change was found Confirmed by Ezequiel Essex [id number]) on 10/15/2022 11:24:51 PM  Radiology DG Chest 2 View  Result Date: 10/15/2022 CLINICAL DATA:  Chest pain EXAM: CHEST - 2 VIEW COMPARISON:  06/17/2021 FINDINGS: The heart size and mediastinal contours are within normal limits. Both lungs are clear. The visualized skeletal structures are unremarkable. IMPRESSION: Negative. Electronically Signed   By: Rolm Baptise M.D.   On: 10/15/2022 20:27    Procedures Procedures    Medications Ordered in ED Medications - No data to display  ED Course/ Medical Decision Making/ A&P                             Medical Decision Making Amount and/or Complexity of Data Reviewed Labs: ordered. Decision-making details documented in ED Course. Radiology: ordered and independent interpretation performed. Decision-making details documented in ED Course. ECG/medicine tests: ordered and independent interpretation performed. Decision-making details documented in ED Course.  Chest pain since 5:30 PM after moving a mattress earlier in the day.  No  exertional pain at that time.  Pain is reproducible and worse with movement.  EKG shows no acute ischemia or ST changes.  Suspect likely musculoskeletal chest pain.  Low suspicion for ACS or PE.  Chest pain has resolved.  Troponin negative x 2, D-dimer negative.  Discussed with patient she is low risk for ACS but nonzero risk.  Would recommend outpatient follow-up for stress test.  Return to the ED sooner with exertional chest pain, pain associate with shortness of breath, nausea, vomiting, sweating or other concerns.       Final Clinical Impression(s) / ED Diagnoses Final diagnoses:  None  Rx / DC Orders ED Discharge Orders     None         Elenna Spratling, Annie Main, MD 10/16/22 [phone]

## 2022-10-16 NOTE — Discharge Instructions (Signed)
Your testing does not show any evidence of heart attack or blood clot in the lung.  Follow-up with the cardiologist for a stress test.  Return to the ED sooner with exertional chest pain, pain associate with shortness of breath, nausea, vomiting, sweating or any other concerns.

## 2022-10-21 ENCOUNTER — Ambulatory Visit: Payer: BLUE CROSS/BLUE SHIELD | Attending: Internal Medicine | Admitting: Internal Medicine
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
